# Patient Record
Sex: Male | Born: 1935 | Race: Black or African American | Hispanic: No | Marital: Married | Smoking: Former smoker
Health system: Southern US, Community
[De-identification: ages and names within clinical notes are randomized; demographics above are authoritative.]

## PROBLEM LIST (undated history)

## (undated) DIAGNOSIS — I712 Thoracic aortic aneurysm, without rupture, unspecified: Secondary | ICD-10-CM

## (undated) DIAGNOSIS — N184 Chronic kidney disease, stage 4 (severe): Secondary | ICD-10-CM

## (undated) DIAGNOSIS — I10 Essential (primary) hypertension: Secondary | ICD-10-CM

## (undated) HISTORY — PX: THORACIC AORTIC ANEURYSM REPAIR: SHX799

---

## 2017-08-07 ENCOUNTER — Ambulatory Visit: Payer: Self-pay | Admitting: Surgery

## 2017-08-07 NOTE — H&P (Signed)
General Surgery [facility]  Nicholas LionJosiah Lyons DOB: [birth year] Married / Language: Undefined / Race: Refused to Report/Unreported Male  History of Present Illness  The patient is a 83 year old male who presents with an inguinal hernia.  CC: right inguinal hernia  Patient is referred by Dr. Lynett FishBryan Lyons at [facility] for evaluation of right inguinal hernia. Patient first noted symptoms approximately 6 months ago. At that time they had been moving from one location to another and he had been doing some heavy lifting. Patient has noted the bulge coming slightly larger. He has intermittent episodes of discomfort. Hernia has always been reducible. He has never had any prior hernia repairs. He denies any signs or symptoms of intestinal obstruction. Patient presents today to discuss repair of right inguinal hernia with mesh.   Past Surgical History  Aneurysm Repair   Diagnostic Studies History  Colonoscopy  1-5 years ago  Allergies No Known Drug Allergies 07/07/2017 Allergies Reconciled   Medication History AmLODIPine Besylate (10MG  Tablet, Oral) Active. Aspirin (81MG  Tablet Chewable, Oral) Active. Medications Reconciled  Social History Alcohol use  Remotely quit alcohol use. Caffeine use  Carbonated beverages, Coffee. No drug use  Tobacco use  Former smoker.  Family History Family history unknown  First Degree Relatives   Other Problems Arthritis  Chest pain  High blood pressure    Review of Systems General Not Present- Appetite Loss, Chills, Fatigue, Fever, Night Sweats, Weight Gain and Weight Loss. Skin Not Present- Change in Wart/Mole, Dryness, Hives, Jaundice, New Lesions, Non-Healing Wounds, Rash and Ulcer. HEENT Present- Earache, Hearing Loss, Ringing in the Ears and Seasonal Allergies. Not Present- Hoarseness, Nose Bleed, Oral Ulcers, Sinus Pain, Sore Throat, Visual Disturbances, Wears glasses/contact lenses and Yellow  Eyes. Respiratory Present- Difficulty Breathing, Snoring and Wheezing. Not Present- Bloody sputum and Chronic Cough. Breast Not Present- Breast Mass, Breast Pain, Nipple Discharge and Skin Changes. Cardiovascular Present- Chest Pain, Leg Cramps and Shortness of Breath. Not Present- Difficulty Breathing Lying Down, Palpitations, Rapid Heart Rate and Swelling of Extremities. Gastrointestinal Not Present- Abdominal Pain, Bloating, Bloody Stool, Change in Bowel Habits, Chronic diarrhea, Constipation, Difficulty Swallowing, Excessive gas, Gets full quickly at meals, Hemorrhoids, Indigestion, Nausea, Rectal Pain and Vomiting. Male Genitourinary Not Present- Blood in Urine, Change in Urinary Stream, Frequency, Impotence, Nocturia, Painful Urination, Urgency and Urine Leakage. Musculoskeletal Not Present- Back Pain, Joint Pain, Joint Stiffness, Muscle Pain, Muscle Weakness and Swelling of Extremities. Neurological Not Present- Decreased Memory, Fainting, Headaches, Numbness, Seizures, Tingling, Tremor, Trouble walking and Weakness. Psychiatric Not Present- Anxiety, Bipolar, Change in Sleep Pattern, Depression, Fearful and Frequent crying. Endocrine Not Present- Cold Intolerance, Excessive Hunger, Hair Changes, Heat Intolerance, Hot flashes and New Diabetes. Hematology Not Present- Blood Thinners, Easy Bruising, Excessive bleeding, Gland problems, HIV and Persistent Infections.  Vitals Weight: 157.4 lb Height: 69in Body Surface Area: 1.87 m Body Mass Index: 23.24 kg/m  Temp.: 98.47F  Pulse: 81 (Regular)  BP: 142/84 (Sitting, Left Arm, Standard)   Physical Exam  See vital signs recorded above  GENERAL APPEARANCE Development: normal Nutritional status: normal Gross deformities: none  SKIN Rash, lesions, ulcers: none Induration, erythema: none Nodules: none palpable  EYES Conjunctiva and lids: normal Pupils: equal and reactive Iris: normal bilaterally  EARS, NOSE, MOUTH,  THROAT External ears: no lesion or deformity External nose: no lesion or deformity Hearing: grossly normal Lips: no lesion or deformity Dentition: normal for age Oral mucosa: moist  NECK Symmetric: yes Trachea: midline Thyroid: no palpable nodules  in the thyroid bed  CHEST Respiratory effort: normal Retraction or accessory muscle use: no Breath sounds: normal bilaterally Rales, rhonchi, wheeze: none Well-healed transverse scar anterior upper chest wall and epigastrium  CARDIOVASCULAR Auscultation: regular rhythm, normal rate Murmurs: none Pulses: carotid and radial pulse 2+ palpable Lower extremity edema: none Lower extremity varicosities: none  ABDOMEN Distension: none Masses: none palpable Tenderness: none Hepatosplenomegaly: not present Hernia: not present  GENITOURINARY Penis: no lesions Scrotum: no masses Obvious bulge right groin. Palpation right inguinal canal with cough and Valsalva shows a small to moderate bulge which augments but is reducible with gentle manipulation. Hernia prolapses with slight Valsalva. Palpation in the left inguinal canal with cough and Valsalva shows no sign of hernia.  MUSCULOSKELETAL Station and gait: normal Digits and nails: no clubbing or cyanosis Muscle strength: grossly normal all extremities Range of motion: grossly normal all extremities Deformity: none  LYMPHATIC Cervical: none palpable Supraclavicular: none palpable  PSYCHIATRIC Oriented to person, place, and time: yes Mood and affect: normal for situation Judgment and insight: appropriate for situation    Assessment & Plan  INGUINAL HERNIA OF RIGHT SIDE WITHOUT OBSTRUCTION OR GANGRENE (K40.90)  Pt Education - Pamphlet Given - Hernia Surgery: discussed with patient and provided information. Patient is referred by his urologist for evaluation of right inguinal hernia. He is accompanied by a family member. They are provided with written literature on hernia  repair to review at home.  I have recommended repair of right inguinal hernia by open technique using prosthetic mesh. We have discussed the risk and benefits of the procedure. We discussed the use of mesh. We discussed restrictions on his activities following the surgery. This would be an outpatient surgical procedure. Patient understands and wishes to proceed with surgery after he returns from an upcoming trip to [place].  The risks and benefits of the procedure have been discussed at length with the patient. The patient understands the proposed procedure, potential alternative treatments, and the course of recovery to be expected. All of the patient's questions have been answered at this time. The patient wishes to proceed with surgery.  Darnell Levelodd Solimar Maiden, MD [facility] Office: [phone]

## 2017-09-08 ENCOUNTER — Encounter (HOSPITAL_COMMUNITY): Admission: RE | Admit: 2017-09-08 | Payer: Medicare Other | Source: Ambulatory Visit

## 2017-09-11 ENCOUNTER — Ambulatory Visit (HOSPITAL_COMMUNITY): Admission: RE | Admit: 2017-09-11 | Payer: Medicare Other | Source: Ambulatory Visit | Admitting: Surgery

## 2017-09-11 ENCOUNTER — Encounter (HOSPITAL_COMMUNITY): Admission: RE | Payer: Self-pay | Source: Ambulatory Visit

## 2017-09-11 SURGERY — REPAIR, HERNIA, INGUINAL, ADULT
Anesthesia: General | Laterality: Right

## 2017-11-07 ENCOUNTER — Other Ambulatory Visit: Payer: Self-pay

## 2017-11-07 ENCOUNTER — Encounter (HOSPITAL_BASED_OUTPATIENT_CLINIC_OR_DEPARTMENT_OTHER): Payer: Self-pay | Admitting: *Deleted

## 2017-11-07 ENCOUNTER — Observation Stay (HOSPITAL_BASED_OUTPATIENT_CLINIC_OR_DEPARTMENT_OTHER)
Admission: EM | Admit: 2017-11-07 | Discharge: 2017-11-08 | Disposition: A | Discharge status: Home / Self Care | Payer: Medicare Other | Attending: Internal Medicine | Admitting: Internal Medicine

## 2017-11-07 ENCOUNTER — Emergency Department (HOSPITAL_BASED_OUTPATIENT_CLINIC_OR_DEPARTMENT_OTHER): Payer: Medicare Other

## 2017-11-07 DIAGNOSIS — R748 Abnormal levels of other serum enzymes: Secondary | ICD-10-CM | POA: Insufficient documentation

## 2017-11-07 DIAGNOSIS — R7989 Other specified abnormal findings of blood chemistry: Secondary | ICD-10-CM

## 2017-11-07 DIAGNOSIS — R778 Other specified abnormalities of plasma proteins: Secondary | ICD-10-CM

## 2017-11-07 DIAGNOSIS — N184 Chronic kidney disease, stage 4 (severe): Secondary | ICD-10-CM | POA: Diagnosis not present

## 2017-11-07 DIAGNOSIS — I129 Hypertensive chronic kidney disease with stage 1 through stage 4 chronic kidney disease, or unspecified chronic kidney disease: Secondary | ICD-10-CM | POA: Diagnosis not present

## 2017-11-07 DIAGNOSIS — R079 Chest pain, unspecified: Secondary | ICD-10-CM | POA: Diagnosis present

## 2017-11-07 DIAGNOSIS — Z87891 Personal history of nicotine dependence: Secondary | ICD-10-CM | POA: Diagnosis not present

## 2017-11-07 DIAGNOSIS — I160 Hypertensive urgency: Secondary | ICD-10-CM | POA: Insufficient documentation

## 2017-11-07 DIAGNOSIS — Z79899 Other long term (current) drug therapy: Secondary | ICD-10-CM | POA: Insufficient documentation

## 2017-11-07 DIAGNOSIS — R072 Precordial pain: Secondary | ICD-10-CM | POA: Diagnosis not present

## 2017-11-07 DIAGNOSIS — D649 Anemia, unspecified: Secondary | ICD-10-CM | POA: Insufficient documentation

## 2017-11-07 DIAGNOSIS — I10 Essential (primary) hypertension: Secondary | ICD-10-CM | POA: Diagnosis not present

## 2017-11-07 HISTORY — DX: Essential (primary) hypertension: I10

## 2017-11-07 HISTORY — DX: Thoracic aortic aneurysm, without rupture, unspecified: I71.20

## 2017-11-07 HISTORY — DX: Chronic kidney disease, stage 4 (severe): N18.4

## 2017-11-07 HISTORY — DX: Thoracic aortic aneurysm, without rupture: I71.2

## 2017-11-07 LAB — BASIC METABOLIC PANEL
Anion gap: 9 (ref 5–15)
BUN: 26 mg/dL — ABNORMAL HIGH (ref 6–20)
CALCIUM: 9.2 mg/dL (ref 8.9–10.3)
CO2: 22 mmol/L (ref 22–32)
CREATININE: 2.79 mg/dL — AB (ref 0.61–1.24)
Chloride: 108 mmol/L (ref 101–111)
GFR, EST AFRICAN AMERICAN: 22 mL/min — AB (ref >60)
GFR, EST NON AFRICAN AMERICAN: 19 mL/min — AB (ref >60)
Glucose, Bld: 95 mg/dL (ref 65–99)
Potassium: 4.4 mmol/L (ref 3.5–5.1)
SODIUM: 139 mmol/L (ref 135–145)

## 2017-11-07 LAB — CBC WITH DIFFERENTIAL/PLATELET
BASOS ABS: 0 10*3/uL (ref 0.0–0.1)
BASOS PCT: 0 %
EOS ABS: 0.3 10*3/uL (ref 0.0–0.7)
EOS PCT: 3 %
HCT: 35.3 % — ABNORMAL LOW (ref 39.0–52.0)
Hemoglobin: 11.8 g/dL — ABNORMAL LOW (ref 13.0–17.0)
Lymphocytes Relative: 38 %
Lymphs Abs: 2.9 10*3/uL (ref 0.7–4.0)
MCH: 28.4 pg (ref 26.0–34.0)
MCHC: 33.4 g/dL (ref 30.0–36.0)
MCV: 84.9 fL (ref 78.0–100.0)
Monocytes Absolute: 0.7 10*3/uL (ref 0.1–1.0)
Monocytes Relative: 9 %
Neutro Abs: 3.7 10*3/uL (ref 1.7–7.7)
Neutrophils Relative %: 50 %
PLATELETS: 189 10*3/uL (ref 150–400)
RBC: 4.16 MIL/uL — AB (ref 4.22–5.81)
RDW: 13.4 % (ref 11.5–15.5)
WBC: 7.6 10*3/uL (ref 4.0–10.5)

## 2017-11-07 LAB — TROPONIN I: TROPONIN I: 0.06 ng/mL — AB (ref <0.03)

## 2017-11-07 MED ORDER — HYDRALAZINE HCL 20 MG/ML IJ SOLN: 10.0000 mg | INTRAMUSCULAR | Status: DC | PRN

## 2017-11-07 MED ORDER — ONDANSETRON HCL 4 MG/2ML IJ SOLN: 4.0000 mg | Freq: Four times a day (QID) | INTRAMUSCULAR | Status: DC | PRN

## 2017-11-07 MED ORDER — ASPIRIN 325 MG PO TABS
325.0000 mg | ORAL_TABLET | Freq: Every day | ORAL | Status: DC
  Administered 2017-11-08: 325 mg via ORAL
  Filled 2017-11-07 (×2): qty 1

## 2017-11-07 MED ORDER — AMLODIPINE BESYLATE 10 MG PO TABS
10.0000 mg | ORAL_TABLET | Freq: Every day | ORAL | Status: DC
  Administered 2017-11-07 – 2017-11-08 (×2): 10 mg via ORAL
  Filled 2017-11-07 (×2): qty 1

## 2017-11-07 MED ORDER — ACETAMINOPHEN 325 MG PO TABS: 650.0000 mg | ORAL_TABLET | ORAL | Status: DC | PRN

## 2017-11-07 MED ORDER — ENOXAPARIN SODIUM 40 MG/0.4ML ~~LOC~~ SOLN
40.0000 mg | SUBCUTANEOUS | Status: DC
  Administered 2017-11-07: 40 mg via SUBCUTANEOUS
  Filled 2017-11-07: qty 0.4

## 2017-11-07 MED ORDER — FERROUS SULFATE 325 (65 FE) MG PO TABS
325.0000 mg | ORAL_TABLET | Freq: Every day | ORAL | Status: DC
  Administered 2017-11-08: 325 mg via ORAL
  Filled 2017-11-07: qty 1

## 2017-11-07 MED ORDER — AMLODIPINE BESYLATE 10 MG PO TABS: 10.0000 mg | ORAL_TABLET | Freq: Every day | ORAL | Status: DC

## 2017-11-07 NOTE — ED Notes (Signed)
ED Provider at bedside. 

## 2017-11-07 NOTE — Plan of Care (Signed)
  Education: Knowledge of General Education information will improve 11/07/2017 2333 - Completed/Met by Theora Gianotti, RN   Pt oriented to unit, plan of care, and method of reporting concerns. Verbalizes understanding of need to call for assistance prior to ambulation when necessary. Bedside table and call light within reach.

## 2017-11-07 NOTE — ED Notes (Signed)
[facility]

## 2017-11-07 NOTE — ED Notes (Signed)
Spoke with the patient and wife and both of them stated that he does not want to be admitted.  Dr. Eudelia Bunchardama and Dr. Marijean Bravoaye G both spoke with patient and family.

## 2017-11-07 NOTE — ED Notes (Addendum)
Patient's son refused to sign the consent for [facility] to be [facility].  He stated that he just called the PCP for EKG result and they are going to fax it here to be compared.

## 2017-11-07 NOTE — ED Triage Notes (Addendum)
Chest pain since this am. Sharp pain in his left chest that comes and goes. [facility] to coming here. He had a normal EKG there.

## 2017-11-07 NOTE — ED Notes (Signed)
Arline AspCindy of [facility] called for report and informed her that patient refused to be admitted.  Patient refused to signed transfer consent.

## 2017-11-07 NOTE — ED Provider Notes (Signed)
[facility] EMERGENCY DEPARTMENT Provider Note   CSN: [id number] Arrival date & time: 11/07/17  1435     History   Chief Complaint Chief Complaint  Patient presents with  . Chest Pain    HPI Nicholas Lyons is a 84 y.o. male with history of hypertension presenting with chest pain for the last 4 hours. Patient is here with his wife and his son who contributed to history.  Patient reports having recurrent left chest pain since 11 am this morning. He says he was cooking when he started having chest pain.  He is pointing at his left chest just under his left breast to localize the pain. Denies radiation. Pain was sharp and lasts few seconds.  He also reports intermittent exertional dyspnea although the family disagrees with him saying he overexerts himself.  He denies leg swelling or calf tenderness.  He recently traveled from [place] to [place] 3 weeks ago. Wife says it was a 7 hour car ride. He has history of hypertension.  He has been out of his blood pressure medication for the last 3 weeks.  He also reports history of thoracic aortic aneurysm surgery in 2008 in 2014. Denies history of shingles or blood clot. Denies history of heart attack or stroke. He denies headache, vision change, nausea, vomiting, diaphoresis, abdominal pain, dysuria or focal numbness or weakness. Denies smoking cigarettes.  Denies drinking alcohol or recreational drug use.  HPI  Past Medical History:  Diagnosis Date  . Abdominal aneurysm (HCC)   . Hypertension     There are no active problems to display for this patient.   Past Surgical History:  Procedure Laterality Date  . ABDOMINAL SURGERY         Home Medications    Prior to Admission medications   Medication Sig Start Date End Date Taking? Authorizing Provider  amLODipine (NORVASC) 10 MG tablet Take 10 mg by mouth daily.    [provider]  Multiple Vitamin (ONE-A-DAY MENS PO) Take 1 tablet by mouth daily.     [provider]    Family History No family history on file.  Social History Social History   Tobacco Use  . Smoking status: Former Games developermoker  . Smokeless tobacco: Never Used  Substance Use Topics  . Alcohol use: No    Frequency: Never  . Drug use: No     Allergies   Patient has no known allergies.   Review of Systems Review of Systems  Constitutional: Negative for chills and fever.  HENT: Negative for congestion, ear pain, rhinorrhea and sore throat.   Eyes: Negative for visual disturbance.  Respiratory: Positive for shortness of breath. Negative for cough.   Cardiovascular: Positive for chest pain. Negative for palpitations.  Gastrointestinal: Negative for abdominal pain, blood in stool, diarrhea, nausea and vomiting.  Genitourinary: Negative for dysuria and hematuria.  Musculoskeletal: Negative for arthralgias and back pain.  Skin: Negative for color change and rash.  Neurological: Negative for seizures and syncope.  All other systems reviewed and are negative.  Physical Exam Updated Vital Signs BP (!) 182/83   Pulse (!) 58   Temp 97.6 F (36.4 C) (Oral)   Resp (!) 22   Ht 5\' 9"  (1.753 m)   Wt 70.3 kg (155 lb)   SpO2 100%   BMI 22.89 kg/m   Physical Exam GEN: appears well, no apparent distress. Head: normocephalic and atraumatic  Eyes: conjunctiva without injection, sclera anicteric Oropharynx: mmm without erythema, exudation or  petechiae. HEM: negative for cervical or periauricular lymphadenopathies CVS: Bradycardic to 56, RR, nl s1 & s2, no murmurs, no edema, radial pulses 2+ bilaterally. RESP: no IWOB, good air movement bilaterally, CTAB GI: BS present & normal, soft, NTND GU: no suprapubic or CVA tenderness MSK: Tenderness to palpation over his ribs just lateral to his left breast.  No swelling overlying skin lesion. SKIN: no apparent skin lesion NEURO: alert and oiented appropriately, no gross deficits  PSYCH: euthymic mood with  congruent affect  ED Treatments / Results  Labs (all labs ordered are listed, but only abnormal results are displayed) Labs Reviewed  BASIC METABOLIC PANEL - Abnormal; Notable for the following components:      Result Value   BUN 26 (*)    Creatinine, Ser 2.79 (*)    GFR calc non Af Amer 19 (*)    GFR calc Af Amer 22 (*)    All other components within normal limits  CBC WITH DIFFERENTIAL/PLATELET - Abnormal; Notable for the following components:   RBC 4.16 (*)    Hemoglobin 11.8 (*)    HCT 35.3 (*)    All other components within normal limits  TROPONIN I  D-DIMER, QUANTITATIVE (NOT AT [facility])    EKG  EKG Interpretation None       Radiology Dg Chest 2 View  Result Date: 11/07/2017 CLINICAL DATA:  Chest pain today, dyspnea, former smoker. EXAM: CHEST - 2 VIEW COMPARISON:  None. FINDINGS: Heart size is normal. The patient is status post median sternotomy and aortic stent grafting from the aortic arch to proximal descending thoracic aorta. Lungs are hyperinflated but clear. Apical pleuroparenchymal thickening is noted bilaterally. No acute nor suspicious osseous lesions. Calcified left hilar lymph node is noted. IMPRESSION: Mild pulmonary hyperinflation without active pulmonary disease. Electronically Signed   By: Tollie Eth M.D.   On: 11/07/2017 15:43    Procedures Procedures (including critical care time)  Medications Ordered in ED Medications - No data to display   Initial Impression / Assessment and Plan / ED Course  I have reviewed the triage vital signs and the nursing notes.  Pertinent labs & imaging results that were available during my care of the patient were reviewed by me and considered in my medical decision making (see chart for details).  Clinical Course as of Nov 07 1624  Fri Nov 07, 2017  1621 Troponin I: (!!) 0.06 [PC]    Clinical Course User Index [PC] Nira Conn, MD   20:35 PM 84 year old male with history of hypertension (off medication  for the last 3 weeks) presented with a typical chest pain for the last 4 hours.  He is tender to palpation lateral to his left breast.  He has no swelling or overlying skin lesion.  He denies history of shingles.  He also reports exertional dyspnea although family thinks the later was likely due to him over exerting himself. Initial EKG with bradycardia, first-degree AV block and TW flattening.  Initial troponin 0 0.06 warranting ACS rule out. Will obtain CTA dissection protocol given history of thoracic aortic aneurysm and recent long car ride.  He has no signs and symptoms of infectious etiology.   4:42 PM GFR 22. Cancel CTA and transfer to [facility] for MRI to rule out dissection. Admitting team consulted.  5:45 PM Patient reports having similar chest pain over the same location for the last 5 years although he initially said his chest pain is new. Son upset about admission despite all efforts  to explain the need to rule out ACS.   Patient admitted to [facility] for ACS rule out and further evaluation.  Final Clinical Impressions(s) / ED Diagnoses   Final diagnoses:  None    ED Discharge Orders    None       Almon Hercules, MD 11/07/17 2031    Nira Conn, MD 11/07/17 2351    Nira Conn, MD 11/07/17 [phone]

## 2017-11-07 NOTE — ED Notes (Signed)
Family at bedside. 

## 2017-11-07 NOTE — H&P (Signed)
History and Physical    Nicholas Lyons:[id number] DOB: Jun 15, 1930 DOA: 11/07/2017  PCP: Filomena Jungling, NP  Patient coming from: Home  I have personally briefly reviewed patient's old medical records in [facility]  Chief Complaint: Chest pain  HPI: Nicholas Lyons is a 84 y.o. male with medical history significant of CKD stage 4, HTN, ascending aortic aneurysm s/p repair surgeries in 2008 and 2014.  Patient reports having recurrent left chest pain since 11 am this morning. He says he was cooking when he started having chest pain.  He is pointing at his left chest just under his left breast to localize the pain. Denies radiation. Pain was sharp and lasts few seconds.  Per family he has been having episodic CP like this for the past 5 years since open heart surgery.  He has had 2 times where he had worse episodes like today.  First time was after 2 of his Sons died around the same time, second was today.  Due to being out of town, he has been out of his blood pressure medication for the last 3 weeks.   ED Course: BP 192/100 in ED, couple of readings above 180.  Trop 0.06.  They were going to get CTA to r/o dissection but his creat is 2.7 today (turns out CKD stage 4 is chronic).  Also his CP is resolved completely at the moment (not really consistent with acute dissection).   Review of Systems: As per HPI otherwise 10 point review of systems negative.   Past Medical History:  Diagnosis Date  . CKD (chronic kidney disease) stage 4, GFR 15-29 ml/min (HCC)   . Hypertension   . Thoracic aortic aneurysm [facility])     Past Surgical History:  Procedure Laterality Date  . THORACIC AORTIC ANEURYSM REPAIR  2008, 2014     reports that he has quit smoking. he has never used smokeless tobacco. He reports that he does not drink alcohol or use drugs.  No Known Allergies  History reviewed. No pertinent family history.   Prior to Admission medications   Medication Sig Start Date End Date  Taking? Authorizing Provider  amLODipine (NORVASC) 10 MG tablet Take 10 mg by mouth daily.    [provider]  Multiple Vitamin (ONE-A-DAY MENS PO) Take 1 tablet by mouth daily.    [provider]    Physical Exam: Vitals:   11/07/17 1800 11/07/17 1830 11/07/17 1900 11/07/17 2111  BP: (!) 176/93 (!) 167/76 (!) 181/90 (!) 177/73  Pulse: (!) 58 (!) 56 61 (!) 56  Resp: (!) 22 (!) 0 (!) 23 20  Temp:    (!) 97.5 F (36.4 C)  TempSrc:    Oral  SpO2: 100% 100% 100% 100%  Weight:      Height:        Constitutional: NAD, calm, comfortable Eyes: PERRL, lids and conjunctivae normal ENMT: Mucous membranes are moist. Posterior pharynx clear of any exudate or lesions.Normal dentition.  Neck: normal, supple, no masses, no thyromegaly Respiratory: clear to auscultation bilaterally, no wheezing, no crackles. Normal respiratory effort. No accessory muscle use.  Cardiovascular: Regular rate and rhythm, no murmurs / rubs / gallops. No extremity edema. 2+ pedal pulses. No carotid bruits.  Abdomen: no tenderness, no masses palpated. No hepatosplenomegaly. Bowel sounds positive.  Musculoskeletal: no clubbing / cyanosis. No joint deformity upper and lower extremities. Good ROM, no contractures. Normal muscle tone.  Skin: no rashes, lesions, ulcers. No induration Neurologic: CN 2-12 grossly intact. Sensation  intact, DTR normal. Strength 5/5 in all 4.  Psychiatric: Normal judgment and insight. Alert and oriented x 3. Normal mood.    Labs on Admission: I have personally reviewed following labs and imaging studies  CBC: Recent Labs  Lab 11/07/17 1527  WBC 7.6  NEUTROABS 3.7  HGB 11.8*  HCT 35.3*  MCV 84.9  PLT 189   Basic Metabolic Panel: Recent Labs  Lab 11/07/17 1527  NA 139  K 4.4  CL 108  CO2 22  GLUCOSE 95  BUN 26*  CREATININE 2.79*  CALCIUM 9.2   GFR: Estimated Creatinine Clearance: 19.2 mL/min (A) (by C-G formula based on SCr of 2.79 mg/dL (H)). Liver Function  Tests: No results for input(s): AST, ALT, ALKPHOS, BILITOT, PROT, ALBUMIN in the last 168 hours. No results for input(s): LIPASE, AMYLASE in the last 168 hours. No results for input(s): AMMONIA in the last 168 hours. Coagulation Profile: No results for input(s): INR, PROTIME in the last 168 hours. Cardiac Enzymes: Recent Labs  Lab 11/07/17 1527  TROPONINI 0.06*   BNP (last 3 results) No results for input(s): PROBNP in the last 8760 hours. HbA1C: No results for input(s): HGBA1C in the last 72 hours. CBG: No results for input(s): GLUCAP in the last 168 hours. Lipid Profile: No results for input(s): CHOL, HDL, LDLCALC, TRIG, CHOLHDL, LDLDIRECT in the last 72 hours. Thyroid Function Tests: No results for input(s): TSH, T4TOTAL, FREET4, T3FREE, THYROIDAB in the last 72 hours. Anemia Panel: No results for input(s): VITAMINB12, FOLATE, FERRITIN, TIBC, IRON, RETICCTPCT in the last 72 hours. Urine analysis: No results found for: COLORURINE, APPEARANCEUR, LABSPEC, PHURINE, GLUCOSEU, HGBUR, BILIRUBINUR, KETONESUR, PROTEINUR, UROBILINOGEN, NITRITE, LEUKOCYTESUR  Radiological Exams on Admission: Dg Chest 2 View  Result Date: 11/07/2017 CLINICAL DATA:  Chest pain today, dyspnea, former smoker. EXAM: CHEST - 2 VIEW COMPARISON:  None. FINDINGS: Heart size is normal. The patient is status post median sternotomy and aortic stent grafting from the aortic arch to proximal descending thoracic aorta. Lungs are hyperinflated but clear. Apical pleuroparenchymal thickening is noted bilaterally. No acute nor suspicious osseous lesions. Calcified left hilar lymph node is noted. IMPRESSION: Mild pulmonary hyperinflation without active pulmonary disease. Electronically Signed   By: Tollie Ethavid  Kwon M.D.   On: 11/07/2017 15:43    EKG: Independently reviewed.  Assessment/Plan Principal Problem:   Chest pain Active Problems:   HTN (hypertension)   CKD (chronic kidney disease) stage 4, GFR 15-29 ml/min (HCC)     1. CP - HTN urgency seems most likely, dissection seems less likely with completely resolved CP at the moment, PE after car ride today is possible but large PE seems less likely given HR 60s and very hypertensive.  CAD is possible. 1. CP obs pathway 2. Serial trops 3. Tele monitor 4. Cards eval in AM 2. HTN - 1. Restart amlodipine 2. Hydralazine PRN if needed 3. CKD stage 4 - 1. Chronic and likely baseline based on labs in care-everywhere from last year 2. Needs OP follow up with nephrology  DVT prophylaxis: Lovenox Code Status: Full Family Communication: Wife at bedside Disposition Plan: Home after admit Consults called: Message put into [facility] for cards eval in AM Admission status: Place in [place]   Riot [place], [place] M. DO [facility] Pager [phone]  If 7AM-7PM, please contact day team taking care of patient [email or web address] Password [email or web address]  11/07/2017, 9:33 PM

## 2017-11-08 ENCOUNTER — Observation Stay (HOSPITAL_BASED_OUTPATIENT_CLINIC_OR_DEPARTMENT_OTHER): Payer: Medicare Other

## 2017-11-08 ENCOUNTER — Encounter (HOSPITAL_COMMUNITY): Payer: Self-pay | Admitting: Cardiology

## 2017-11-08 DIAGNOSIS — I160 Hypertensive urgency: Secondary | ICD-10-CM

## 2017-11-08 DIAGNOSIS — N184 Chronic kidney disease, stage 4 (severe): Secondary | ICD-10-CM | POA: Diagnosis not present

## 2017-11-08 DIAGNOSIS — R748 Abnormal levels of other serum enzymes: Secondary | ICD-10-CM | POA: Diagnosis not present

## 2017-11-08 DIAGNOSIS — R079 Chest pain, unspecified: Secondary | ICD-10-CM | POA: Diagnosis not present

## 2017-11-08 DIAGNOSIS — R072 Precordial pain: Secondary | ICD-10-CM | POA: Diagnosis not present

## 2017-11-08 LAB — NM MYOCAR MULTI W/SPECT W/WALL MOTION / EF
CHL CUP RESTING HR STRESS: 58 {beats}/min
CSEPED: 5 min
CSEPEW: 1 METS
CSEPHR: 69 %
MPHR: 135 {beats}/min
Peak HR: 94 {beats}/min

## 2017-11-08 LAB — TROPONIN I
TROPONIN I: 0.04 ng/mL — AB (ref <0.03)
Troponin I: 0.04 ng/mL (ref <0.03)
Troponin I: 0.04 ng/mL (ref <0.03)

## 2017-11-08 MED ORDER — TECHNETIUM TC 99M TETROFOSMIN IV KIT
10.0000 | PACK | Freq: Once | INTRAVENOUS | Status: AC | PRN
  Administered 2017-11-08: 10 via INTRAVENOUS

## 2017-11-08 MED ORDER — REGADENOSON 0.4 MG/5ML IV SOLN
0.4000 mg | Freq: Once | INTRAVENOUS | Status: AC
  Administered 2017-11-08: 0.4 mg via INTRAVENOUS
  Filled 2017-11-08: qty 5

## 2017-11-08 MED ORDER — REGADENOSON 0.4 MG/5ML IV SOLN
INTRAVENOUS | Status: AC
  Administered 2017-11-08: 0.4 mg via INTRAVENOUS
  Filled 2017-11-08: qty 5

## 2017-11-08 MED ORDER — TECHNETIUM TC 99M TETROFOSMIN IV KIT
30.0000 | PACK | Freq: Once | INTRAVENOUS | Status: AC | PRN
  Administered 2017-11-08: 30 via INTRAVENOUS

## 2017-11-08 NOTE — Discharge Summary (Signed)
Physician Discharge Summary  Nicholas Lyons ZOX:[id number] DOB: Jul 10, 1930 DOA: 11/07/2017  PCP: Filomena Jungling, NP  Admit date: 11/07/2017 Discharge date: 11/08/2017  Admitted From: home  Disposition:  home   Recommendations for Outpatient Follow-up:  1. Needs referral to nephrology Discharge Condition:  stable   CODE STATUS:  Full code   Consultations:  Cardiology     Discharge Diagnoses:  Principal Problem:   Chest pain Active Problems:   HTN (hypertension)   CKD (chronic kidney disease) stage 4, GFR 15-29 ml/min (HCC)    Subjective: No further chest pain - no other complaints.   HPI: Nicholas Lyons is a 84 y.o. male with medical history significant of CKD stage 4, HTN, ascending aortic aneurysm s/p repair surgeries in 2008 and 2014. He presents with chest pain while cooking which was left sided, non radiating and severe. BP in ER noted to be 192/100. He had been out of town for months and did not have his Norvasc. He states his primary care provider stated that he would need to be seen in the office prior to getting a refill on his medication.   Hospital Course:  Chest pain - possibly due to severe HTN - mild elevated troponin at 0.06 to 0.04- pain has resolved- cardiology recommends a Myoview stress test which was performed today and is negative  Hypertensive urgency - due to not having appropriate home meds- have resumed Norvasc and BP has improved- he is advised to continue to check BP at home and f/u with PCP for further management- discussed the importance of adequate BP control  CKD 4 - needs referral to a nephrologist   Anemia  -cont oral Iron- f/u on erythropoietin levels and need for Aranesp (due to severe CKD) as outpt   Discharge Exam: Vitals:   11/08/17 0446 11/08/17 1231  BP: 129/66 (!) 157/72  Pulse: 60 (!) 57  Resp: 20   Temp: 97.9 F (36.6 C) 97.6 F (36.4 C)  SpO2: 97% 100%   Vitals:   11/07/17 2111 11/08/17 0008 11/08/17 0446 11/08/17 1231  BP:  (!) 177/73 (!) 143/70 129/66 (!) 157/72  Pulse: (!) 56 62 60 (!) 57  Resp: 20 20 20    Temp: (!) 97.5 F (36.4 C) 97.7 F (36.5 C) 97.9 F (36.6 C) 97.6 F (36.4 C)  TempSrc: Oral Oral Oral Oral  SpO2: 100% 99% 97% 100%  Weight:  69.5 kg (153 lb 3.2 oz)    Height:  5\' 9"  (1.753 m)      General: Pt is alert, awake, not in acute distress Cardiovascular: RRR, S1/S2 +, no rubs, no gallops Respiratory: CTA bilaterally, no wheezing, no rhonchi Abdominal: Soft, NT, ND, bowel sounds + Extremities: no edema, no cyanosis   Discharge Instructions  Discharge Instructions    Diet - low sodium heart healthy   Complete by:  As directed    Increase activity slowly   Complete by:  As directed      Allergies as of 11/08/2017      Reactions   Lisinopril Anaphylaxis, Swelling      Medication List    TAKE these medications   amLODipine 10 MG tablet Commonly known as:  NORVASC Take 10 mg by mouth daily.   aspirin EC 325 MG tablet Take 650 mg by mouth daily.   ferrous sulfate 325 (65 FE) MG tablet Take 325 mg by mouth daily with breakfast.   ONE-A-DAY MENS PO Take 1 tablet by mouth daily.  Allergies  Allergen Reactions  . Lisinopril Anaphylaxis and Swelling     Procedures/Studies:  Dg Chest 2 View  Result Date: 11/07/2017 CLINICAL DATA:  Chest pain today, dyspnea, former smoker. EXAM: CHEST - 2 VIEW COMPARISON:  None. FINDINGS: Heart size is normal. The patient is status post median sternotomy and aortic stent grafting from the aortic arch to proximal descending thoracic aorta. Lungs are hyperinflated but clear. Apical pleuroparenchymal thickening is noted bilaterally. No acute nor suspicious osseous lesions. Calcified left hilar lymph node is noted. IMPRESSION: Mild pulmonary hyperinflation without active pulmonary disease. Electronically Signed   By: Tollie Ethavid  Kwon M.D.   On: 11/07/2017 15:43   Nm Myocar Multi W/spect W/wall Motion / Ef  Result Date: 11/08/2017  There was  no ST segment deviation noted during stress.  No T wave inversion was noted during stress.  Nuclear stress EF: 54%.  The left ventricular ejection fraction is mildly decreased (45-54%).  This is a low risk study. The study is normal.     - The results of significant diagnostics from this hospitalization (including imaging, microbiology, ancillary and laboratory) are listed below for reference.     Microbiology: No results found for this or any previous visit (from the past 240 hour(s)).   Labs: BNP (last 3 results) No results for input(s): BNP in the last 8760 hours. Basic Metabolic Panel: Recent Labs  Lab 11/07/17 1527  NA 139  K 4.4  CL 108  CO2 22  GLUCOSE 95  BUN 26*  CREATININE 2.79*  CALCIUM 9.2   Liver Function Tests: No results for input(s): AST, ALT, ALKPHOS, BILITOT, PROT, ALBUMIN in the last 168 hours. No results for input(s): LIPASE, AMYLASE in the last 168 hours. No results for input(s): AMMONIA in the last 168 hours. CBC: Recent Labs  Lab 11/07/17 1527  WBC 7.6  NEUTROABS 3.7  HGB 11.8*  HCT 35.3*  MCV 84.9  PLT 189   Cardiac Enzymes: Recent Labs  Lab 11/07/17 1527 11/07/17 2308 11/08/17 0109 11/08/17 0348  TROPONINI 0.06* 0.04* 0.04* 0.04*   BNP: Invalid input(s): POCBNP CBG: No results for input(s): GLUCAP in the last 168 hours. D-Dimer No results for input(s): DDIMER in the last 72 hours. Hgb A1c No results for input(s): HGBA1C in the last 72 hours. Lipid Profile No results for input(s): CHOL, HDL, LDLCALC, TRIG, CHOLHDL, LDLDIRECT in the last 72 hours. Thyroid function studies No results for input(s): TSH, T4TOTAL, T3FREE, THYROIDAB in the last 72 hours.  Invalid input(s): FREET3 Anemia work up No results for input(s): VITAMINB12, FOLATE, FERRITIN, TIBC, IRON, RETICCTPCT in the last 72 hours. Urinalysis No results found for: COLORURINE, APPEARANCEUR, LABSPEC, PHURINE, GLUCOSEU, HGBUR, BILIRUBINUR, KETONESUR, PROTEINUR,  UROBILINOGEN, NITRITE, LEUKOCYTESUR Sepsis Labs Invalid input(s): PROCALCITONIN,  WBC,  LACTICIDVEN Microbiology No results found for this or any previous visit (from the past 240 hour(s)).   Time coordinating discharge: Over 30 minutes  SIGNED:   Calvert CantorSaima Krayton Wortley, MD  [facility] 11/08/2017, 2:24 PM Pager   If 7PM-7AM, please contact night-coverage [email or web address] Password [email or web address]

## 2017-11-08 NOTE — Consult Note (Addendum)
Cardiology Consult    Patient ID: Elyan Vanwieren MRN: [id number], DOB/AGE: 1930-05-26   Admit date: 11/07/2017 Date of Consult: 11/08/2017  Primary Physician: Filomena Jungling, NP Primary Cardiologist: Ellis Parents ( Moved here from [place] 3 years ago) Requesting Provider: Dr. Anthoney Harada Reason for Consultation: Chest pain  Shalik Sanfilippo is a 84 y.o. male who is being seen today for the evaluation of chest pain at the request of Dr. Anthoney Harada.   Patient Profile    84 yo male with PMH of HTN, Thoracic aortic aneurysm s/p repair, and CKD stage IV who presented with hypertension and chest pain with positive troponin.   Past Medical History   Past Medical History:  Diagnosis Date  . CKD (chronic kidney disease) stage 4, GFR 15-29 ml/min (HCC)   . Hypertension   . Thoracic aortic aneurysm [facility])     Past Surgical History:  Procedure Laterality Date  . THORACIC AORTIC ANEURYSM REPAIR  2008, 2014     Allergies  No Known Allergies  History of Present Illness    Mr. Gastelum is a 84 yo male with PMH of  HTN, Thoracic aortic aneurysm s/p repair, and CKD stage IV. Reports he was living in [place] until about 3 years when he and his wife moved here to be closer to his son. Had a thoracic aortic aneurysm repair back in 2014 and thinks he had a heart cath at that time, but unsure. Does report having a stress test back about 3 years ago prior to moving here that was normal. States he has had episodes of chest pain since having his surgery around his incision site that come and go and brief in nature. He is very active around his home and does not normally have episodes of chest pain or shortness of breath.   Yesterday morning around 11am he was standing in the kitchen cooking when he developed sudden onset of sharp left sided chest pain. Rated this a 10/10 in nature. Symptoms lingered for several hours and he presented to [facility]. There his blood pressure was noted to elevated in the 190s systolic. He did report being out of his  blood pressures medications for the past 3 weeks as he traveled back home. Pain subsided as his blood pressure improved. EKG showed SB with 1st degree AVB. Trop 0.06>>0.05>>0.04 in the setting of CKD with Cr 2.7. No further episodes of chest pain since admission.   Inpatient Medications    . amLODipine  10 mg Oral Daily  . aspirin  325 mg Oral Daily  . enoxaparin (LOVENOX) injection  40 mg Subcutaneous Q24H  . ferrous sulfate  325 mg Oral Q breakfast    Family History    Family History  Problem Relation Age of Onset  . Heart disease Mother     Social History    Social History   Socioeconomic History  . Marital status: Married    Spouse name: Not on file  . Number of children: Not on file  . Years of education: Not on file  . Highest education level: Not on file  Social Needs  . Financial resource strain: Not on file  . Food insecurity - worry: Not on file  . Food insecurity - inability: Not on file  . Transportation needs - medical: Not on file  . Transportation needs - non-medical: Not on file  Occupational History  . Not on file  Tobacco Use  . Smoking status: Former Games developer  . Smokeless tobacco: Never Used  Substance and  Sexual Activity  . Alcohol use: No    Frequency: Never  . Drug use: No  . Sexual activity: Not on file  Other Topics Concern  . Not on file  Social History Narrative  . Not on file     Review of Systems    See HPI  All other systems reviewed and are otherwise negative except as noted above.  Physical Exam    Blood pressure 129/66, pulse 60, temperature 97.9 F (36.6 C), temperature source Oral, resp. rate 20, height 5\' 9"  (1.753 m), weight 153 lb 3.2 oz (69.5 kg), SpO2 97 %.  General: Pleasant, older AAM, NAD Psych: Normal affect. Neuro: Alert and oriented X 3. Moves all extremities spontaneously. HEENT: Normal  Neck: Supple without bruits or JVD. Lungs:  Resp regular and unlabored, CTA. Heart: RRR no s3, s4, or murmurs. Abdomen:  Soft, non-tender, non-distended, BS + x 4.  Extremities: No clubbing, cyanosis or edema. DP/PT/Radials 2+ and equal bilaterally.  Labs    Troponin (Point of Care Test) No results for input(s): TROPIPOC in the last 72 hours. Recent Labs    11/07/17 1527 11/07/17 2308 11/08/17 0109 11/08/17 0348  TROPONINI 0.06* 0.04* 0.04* 0.04*   Lab Results  Component Value Date   WBC 7.6 11/07/2017   HGB 11.8 (L) 11/07/2017   HCT 35.3 (L) 11/07/2017   MCV 84.9 11/07/2017   PLT 189 11/07/2017    Recent Labs  Lab 11/07/17 1527  NA 139  K 4.4  CL 108  CO2 22  BUN 26*  CREATININE 2.79*  CALCIUM 9.2  GLUCOSE 95   No results found for: CHOL, HDL, LDLCALC, TRIG No results found for: [facility]   Radiology Studies    Dg Chest 2 View  Result Date: 11/07/2017 CLINICAL DATA:  Chest pain today, dyspnea, former smoker. EXAM: CHEST - 2 VIEW COMPARISON:  None. FINDINGS: Heart size is normal. The patient is status post median sternotomy and aortic stent grafting from the aortic arch to proximal descending thoracic aorta. Lungs are hyperinflated but clear. Apical pleuroparenchymal thickening is noted bilaterally. No acute nor suspicious osseous lesions. Calcified left hilar lymph node is noted. IMPRESSION: Mild pulmonary hyperinflation without active pulmonary disease. Electronically Signed   By: Tollie Eth M.D.   On: 11/07/2017 15:43    ECG & Cardiac Imaging    EKG:  The EKG was personally reviewed and demonstrates SB with 1st degree AVB  Assessment & Plan    84 yo male with PMH of HTN, Thoracic aortic aneurysm s/p repair, and CKD stage IV who presented with hypertension and chest pain with positive troponin.   1. Chest pain/ Elevated Troponin: Reports episode of chest pain that was stabbing in nature yesterday around 11am that lasted for several hours. Trop with mild elevation 0.06>>0.05>>0.04 in the setting of hypertension and CKD but does have RFs. EKG non ischemic. Will plan for lexiscan  myoview today to rule out ischemia. Asked that patient hold and not eat his breakfast until after testing.   2. HTN: Improved after blood pressure medications.  3. CKD stage IV: Cr 2.7, patient reports he was not aware of kidney disease but wife reports otherwise. Suggest outpatient follow up with nephrology   Signed, Laverda Page, NP-C Pager [phone] 11/08/2017, 8:11 AM    Attending Note:   The patient was seen and examined.  Agree with assessment and plan as noted above.  Changes made to the above note as needed.  Patient seen and independently examined with  Laverda PageLindsay Roberts, NP .   We discussed all aspects of the encounter. I agree with the assessment and plan as stated above.  1.  Chest discomfort: Mr. Idell Picklessler presents with several hours of chest discomfort.  The pain was midsternal.  It started when he was in the kitchen cooking.  Was associated with some shortness of breath.  There was no pleuritic component. No nausea or vomiting.  Troponin levels are  minimally elevated with a relatively flat trend. We do not have any records but he probably had a heart catheterization prior to his thoracic aortic aneurysm repair in 2014.  I think it is reasonable to do a stress Myoview study for further evaluation.  2.  Chronic kidney disease. Stable.    I have spent a total of 40 minutes with patient reviewing hospital  notes , telemetry, EKGs, labs and examining patient as well as establishing an assessment and plan that was discussed with the patient. > 50% of time was spent in direct patient care.   Vesta MixerPhilip J. Caral Whan, Montez HagemanJr., MD, [facility] 11/08/2017, 8:29 AM [address],  [address] Office [phone] Pager [phone]

## 2017-11-08 NOTE — Progress Notes (Signed)
Discharge order obtained.  IV removed intact, telemetry monitor removed.  Reviewed AVS with patient/family, including medications and follow-up appointments.  Patient and family verbalized understanding.  Questions asked and answered.  Belongings given to family/patient.  Copy of AVS signature form placed in chart.

## 2017-11-08 NOTE — Progress Notes (Signed)
   Nicholas Lyons presented for a Lexsican cardiolite today.  No immediate complications.  Stress imaging is pending at this time.  Nicholas PageLindsay Carrieann Spielberg, NP 11/08/2017, 10:03 AM

## 2018-01-01 ENCOUNTER — Ambulatory Visit: Payer: Self-pay | Admitting: Surgery

## 2018-01-10 ENCOUNTER — Encounter (HOSPITAL_COMMUNITY): Payer: Self-pay | Admitting: Surgery

## 2018-01-10 DIAGNOSIS — K4090 Unilateral inguinal hernia, without obstruction or gangrene, not specified as recurrent: Secondary | ICD-10-CM

## 2018-01-10 NOTE — H&P (Signed)
General Surgery [facility]  Ronney Lion DOB: [birth year] Married / Language: Undefined / Race: Refused to Report/Unreported Male   History of Present Illness   The patient is a 84 year old male who presents with an inguinal hernia.  CC: right inguinal hernia  Patient is referred by Dr. Lynett Fish at [facility] for evaluation of right inguinal hernia. Patient first noted symptoms approximately 6 months ago. At that time they had been moving from one location to another and he had been doing some heavy lifting. Patient has noted the bulge coming slightly larger. He has intermittent episodes of discomfort. Hernia has always been reducible. He has never had any prior hernia repairs. He denies any signs or symptoms of intestinal obstruction. Patient presents today to discuss repair of right inguinal hernia with mesh.   Past Surgical History Aneurysm Repair   Diagnostic Studies History Colonoscopy  1-5 years ago  Allergies No Known Drug Allergies 07/07/2017 Allergies Reconciled   Medication History AmLODIPine Besylate (  Tablet, Oral) Active. Aspirin (  Tablet Chewable, Oral) Active. Medications Reconciled  Social History Alcohol use  Remotely quit alcohol use. Caffeine use  Carbonated beverages, Coffee. No drug use  Tobacco use  Former smoker.  Family History Family history unknown  First Degree Relatives   Other Problems Arthritis  Chest pain  High blood pressure   Review of Systems General Not Present- Appetite Loss, Chills, Fatigue, Fever, Night Sweats, Weight Gain and Weight Loss. Skin Not Present- Change in Wart/Mole, Dryness, Hives, Jaundice, New Lesions, Non-Healing Wounds, Rash and Ulcer. HEENT Present- Earache, Hearing Loss, Ringing in the Ears and Seasonal Allergies. Not Present- Hoarseness, Nose Bleed, Oral Ulcers, Sinus Pain, Sore Throat, Visual Disturbances, Wears glasses/contact lenses and Yellow  Eyes. Respiratory Present- Difficulty Breathing, Snoring and Wheezing. Not Present- Bloody sputum and Chronic Cough. Breast Not Present- Breast Mass, Breast Pain, Nipple Discharge and Skin Changes. Cardiovascular Present- Chest Pain, Leg Cramps and Shortness of Breath. Not Present- Difficulty Breathing Lying Down, Palpitations, Rapid Heart Rate and Swelling of Extremities. Gastrointestinal Not Present- Abdominal Pain, Bloating, Bloody Stool, Change in Bowel Habits, Chronic diarrhea, Constipation, Difficulty Swallowing, Excessive gas, Gets full quickly at meals, Hemorrhoids, Indigestion, Nausea, Rectal Pain and Vomiting. Male Genitourinary Not Present- Blood in Urine, Change in Urinary Stream, Frequency, Impotence, Nocturia, Painful Urination, Urgency and Urine Leakage. Musculoskeletal Not Present- Back Pain, Joint Pain, Joint Stiffness, Muscle Pain, Muscle Weakness and Swelling of Extremities. Neurological Not Present- Decreased Memory, Fainting, Headaches, Numbness, Seizures, Tingling, Tremor, Trouble walking and Weakness. Psychiatric Not Present- Anxiety, Bipolar, Change in Sleep Pattern, Depression, Fearful and Frequent crying. Endocrine Not Present- Cold Intolerance, Excessive Hunger, Hair Changes, Heat Intolerance, Hot flashes and New Diabetes. Hematology Not Present- Blood Thinners, Easy Bruising, Excessive bleeding, Gland problems, HIV and Persistent Infections.  Vitals Weight: 157.4 lb Height: 69in Body Surface Area: 1.87 m Body Mass Index: 23.24 kg/m  Temp.: 98.75F  Pulse: 81 (Regular)  BP: 142/84 (Sitting, Left Arm, Standard)  Physical Exam  CONSTITUTIONAL See vital signs recorded above  GENERAL APPEARANCE Development: normal Nutritional status: normal Gross deformities: none  SKIN Rash, lesions, ulcers: none Induration, erythema: none Nodules: none palpable  EYES Conjunctiva and lids: normal Pupils: equal and reactive Iris: normal bilaterally  EARS,  NOSE, MOUTH, THROAT External ears: no lesion or deformity External nose: no lesion or deformity Hearing: grossly normal Lips: no lesion or deformity Dentition: normal for age Oral mucosa: moist  NECK Symmetric: yes Trachea: midline Thyroid: no palpable nodules in  the thyroid bed  CHEST Respiratory effort: normal Retraction or accessory muscle use: no Breath sounds: normal bilaterally Rales, rhonchi, wheeze: none Well-healed transverse scar anterior upper chest wall and epigastrium  CARDIOVASCULAR Auscultation: regular rhythm, normal rate Murmurs: none Pulses: carotid and radial pulse 2+ palpable Lower extremity edema: none Lower extremity varicosities: none  ABDOMEN Distension: none Masses: none palpable Tenderness: none Hepatosplenomegaly: not present Hernia: not present  GENITOURINARY Penis: no lesions Scrotum: no masses Obvious bulge right groin. Palpation right inguinal canal with cough and Valsalva shows a small to moderate bulge which augments but is reducible with gentle manipulation. Hernia prolapses with slight Valsalva. Palpation in the left inguinal canal with cough and Valsalva shows no sign of hernia.  MUSCULOSKELETAL Station and gait: normal Digits and nails: no clubbing or cyanosis Muscle strength: grossly normal all extremities Range of motion: grossly normal all extremities Deformity: none  LYMPHATIC Cervical: none palpable Supraclavicular: none palpable  PSYCHIATRIC Oriented to person, place, and time: yes Mood and affect: normal for situation Judgment and insight: appropriate for situation    Assessment & Plan  INGUINAL HERNIA OF RIGHT SIDE WITHOUT OBSTRUCTION OR GANGRENE (K40.90)  Pt Education - Pamphlet Given - Hernia Surgery: discussed with patient and provided information. Patient is referred by his urologist for evaluation of right inguinal hernia. He is accompanied by a family member. They are provided with written literature on  hernia repair to review at home.  I have recommended repair of right inguinal hernia by open technique using prosthetic mesh. We have discussed the risk and benefits of the procedure. We discussed the use of mesh. We discussed restrictions on his activities following the surgery. This would be an outpatient surgical procedure. Patient understands and wishes to proceed with surgery after he returns from an upcoming trip to [place].  The risks and benefits of the procedure have been discussed at length with the patient. The patient understands the proposed procedure, potential alternative treatments, and the course of recovery to be expected. All of the patient's questions have been answered at this time. The patient wishes to proceed with surgery.  Darnell Level, MD [facility] Office: [phone]

## 2018-01-12 NOTE — Patient Instructions (Signed)
Nicholas Lyons  01/12/2018   Your procedure is scheduled on: 01-15-18   Report to [facility] Main  Entrance    Report to admitting at 11:45 AM    Call this number if you have problems the morning of surgery [phone]   Remember: Do not eat food or drink liquids :After Midnight.You may have a Clear Liquid Diet from Midnight until 7:45 AM. After 7:45AM, nothing until after surgery.     CLEAR LIQUID DIET   Foods Allowed                                                                     Foods Excluded  Coffee and tea, regular and decaf                             liquids that you cannot  Plain Jell-O in any flavor                                             see through such as: Fruit ices (not with fruit pulp)                                     milk, soups, orange juice  Iced Popsicles                                    All solid food Carbonated beverages, regular and diet                                    Cranberry, grape and apple juices Sports drinks like Gatorade Lightly seasoned clear broth or consume(fat free) Sugar, honey syrup  Sample Menu Breakfast                                Lunch                                     Supper Cranberry juice                    Beef broth                            Chicken broth Jell-O                                     Grape juice                           Apple  juice Coffee or tea                        Jell-O                                      Popsicle                                                Coffee or tea                        Coffee or tea  _____________________________________________________________________     Take these medicines the morning of surgery with A SIP OF WATER: Amlodipine (Norvasc)              You may not have any metal on your body including hair pins and              piercings  Do not wear jewelry, lotions, powders or deodorant             Men may shave face and  neck.   Do not bring valuables to the hospital. [place] IS NOT             RESPONSIBLE   FOR VALUABLES.  Contacts, dentures or bridgework may not be worn into surgery.      Patients discharged the day of surgery will not be allowed to drive home.  Name and phone number of your driver:  Special Instructions: N/A              Please read over the following fact sheets you were given: _____________________________________________________________________             [facility] - Preparing for Surgery Before surgery, you can play an important role.  Because skin is not sterile, your skin needs to be as free of germs as possible.  You can reduce the number of germs on your skin by washing with CHG (chlorahexidine gluconate) soap before surgery.  CHG is an antiseptic cleaner which kills germs and bonds with the skin to continue killing germs even after washing. Please DO NOT use if you have an allergy to CHG or antibacterial soaps.  If your skin becomes reddened/irritated stop using the CHG and inform your nurse when you arrive at Short Stay. Do not shave (including legs and underarms) for at least 48 hours prior to the first CHG shower.  You may shave your face/neck. Please follow these instructions carefully:  1.  Shower with CHG Soap the night before surgery and the  morning of Surgery.  2.  If you choose to wash your hair, wash your hair first as usual with your  normal  shampoo.  3.  After you shampoo, rinse your hair and body thoroughly to remove the  shampoo.                           4.  Use CHG as you would any other liquid soap.  You can apply chg directly  to the skin and wash                       Gently  with a scrungie or clean washcloth.  5.  Apply the CHG Soap to your body ONLY FROM THE NECK DOWN.   Do not use on face/ open                           Wound or open sores. Avoid contact with eyes, ears mouth and genitals (private parts).                       Wash face,   Genitals (private parts) with your normal soap.             6.  Wash thoroughly, paying special attention to the area where your surgery  will be performed.  7.  Thoroughly rinse your body with warm water from the neck down.  8.  DO NOT shower/wash with your normal soap after using and rinsing off  the CHG Soap.                9.  Pat yourself dry with a clean towel.            10.  Wear clean pajamas.            11.  Place clean sheets on your bed the night of your first shower and do not  sleep with pets. Day of Surgery : Do not apply any lotions/deodorants the morning of surgery.  Please wear clean clothes to the hospital/surgery center.  FAILURE TO FOLLOW THESE INSTRUCTIONS MAY RESULT IN THE CANCELLATION OF YOUR SURGERY PATIENT SIGNATURE_________________________________  NURSE SIGNATURE__________________________________  ________________________________________________________________________

## 2018-01-12 NOTE — Progress Notes (Signed)
11-11-17 (Epic) EKG, CXR

## 2018-01-13 ENCOUNTER — Other Ambulatory Visit: Payer: Self-pay

## 2018-01-13 ENCOUNTER — Encounter (HOSPITAL_COMMUNITY)
Admission: RE | Admit: 2018-01-13 | Discharge: 2018-01-13 | Disposition: A | Discharge status: Home / Self Care | Payer: Medicare Other | Source: Ambulatory Visit | Attending: Surgery | Admitting: Surgery

## 2018-01-13 ENCOUNTER — Encounter (HOSPITAL_COMMUNITY): Payer: Self-pay

## 2018-01-13 DIAGNOSIS — Z79899 Other long term (current) drug therapy: Secondary | ICD-10-CM | POA: Diagnosis not present

## 2018-01-13 DIAGNOSIS — I10 Essential (primary) hypertension: Secondary | ICD-10-CM | POA: Diagnosis not present

## 2018-01-13 DIAGNOSIS — K4090 Unilateral inguinal hernia, without obstruction or gangrene, not specified as recurrent: Secondary | ICD-10-CM | POA: Diagnosis present

## 2018-01-13 DIAGNOSIS — D649 Anemia, unspecified: Secondary | ICD-10-CM | POA: Diagnosis not present

## 2018-01-13 DIAGNOSIS — Z87891 Personal history of nicotine dependence: Secondary | ICD-10-CM | POA: Diagnosis not present

## 2018-01-13 DIAGNOSIS — Z7982 Long term (current) use of aspirin: Secondary | ICD-10-CM | POA: Diagnosis not present

## 2018-01-13 LAB — BASIC METABOLIC PANEL
Anion gap: 8 (ref 5–15)
BUN: 29 mg/dL — ABNORMAL HIGH (ref 6–20)
CHLORIDE: 111 mmol/L (ref 101–111)
CO2: 23 mmol/L (ref 22–32)
CREATININE: 2.62 mg/dL — AB (ref 0.61–1.24)
Calcium: 9.5 mg/dL (ref 8.9–10.3)
GFR calc non Af Amer: 21 mL/min — ABNORMAL LOW (ref >60)
GFR, EST AFRICAN AMERICAN: 24 mL/min — AB (ref >60)
Glucose, Bld: 91 mg/dL (ref 65–99)
Potassium: 4.9 mmol/L (ref 3.5–5.1)
Sodium: 142 mmol/L (ref 135–145)

## 2018-01-13 LAB — CBC
HCT: 37.4 % — ABNORMAL LOW (ref 39.0–52.0)
HEMOGLOBIN: 12.6 g/dL — AB (ref 13.0–17.0)
MCH: 28.8 pg (ref 26.0–34.0)
MCHC: 33.7 g/dL (ref 30.0–36.0)
MCV: 85.4 fL (ref 78.0–100.0)
PLATELETS: 219 10*3/uL (ref 150–400)
RBC: 4.38 MIL/uL (ref 4.22–5.81)
RDW: 13.7 % (ref 11.5–15.5)
WBC: 7.5 10*3/uL (ref 4.0–10.5)

## 2018-01-13 NOTE — Progress Notes (Signed)
01-13-18 Discussed with Dr. Krista Blue regarding Thoracic Aortic Aneurysm which measured 4.2cm on  10-29-16 office visit. No current information available, and pt scheduled to have Inguinal Hernia repair on 01-15-18. Per Dr. Krista Blue, pt is okay to proceed with surgery.

## 2018-01-15 ENCOUNTER — Ambulatory Visit (HOSPITAL_COMMUNITY): Payer: Medicare Other | Admitting: Anesthesiology

## 2018-01-15 ENCOUNTER — Encounter (HOSPITAL_COMMUNITY): Payer: Self-pay | Admitting: Surgery

## 2018-01-15 ENCOUNTER — Ambulatory Visit (HOSPITAL_COMMUNITY)
Admission: RE | Admit: 2018-01-15 | Discharge: 2018-01-15 | Disposition: A | Discharge status: Home / Self Care | Payer: Medicare Other | Source: Ambulatory Visit | Attending: Surgery | Admitting: Surgery

## 2018-01-15 ENCOUNTER — Encounter (HOSPITAL_COMMUNITY)
Admission: RE | Disposition: A | Discharge status: Home / Self Care | Payer: Self-pay | Source: Ambulatory Visit | Attending: Surgery

## 2018-01-15 ENCOUNTER — Other Ambulatory Visit: Payer: Self-pay

## 2018-01-15 DIAGNOSIS — Z87891 Personal history of nicotine dependence: Secondary | ICD-10-CM | POA: Insufficient documentation

## 2018-01-15 DIAGNOSIS — K4090 Unilateral inguinal hernia, without obstruction or gangrene, not specified as recurrent: Secondary | ICD-10-CM | POA: Diagnosis not present

## 2018-01-15 DIAGNOSIS — Z79899 Other long term (current) drug therapy: Secondary | ICD-10-CM | POA: Diagnosis not present

## 2018-01-15 DIAGNOSIS — I10 Essential (primary) hypertension: Secondary | ICD-10-CM | POA: Insufficient documentation

## 2018-01-15 DIAGNOSIS — D649 Anemia, unspecified: Secondary | ICD-10-CM | POA: Insufficient documentation

## 2018-01-15 DIAGNOSIS — Z7982 Long term (current) use of aspirin: Secondary | ICD-10-CM | POA: Insufficient documentation

## 2018-01-15 HISTORY — PX: INSERTION OF MESH: SHX5868

## 2018-01-15 HISTORY — PX: INGUINAL HERNIA REPAIR: SHX194

## 2018-01-15 SURGERY — REPAIR, HERNIA, INGUINAL, ADULT
Anesthesia: Regional | Site: Inguinal | Laterality: Right

## 2018-01-15 MED ORDER — BUPIVACAINE HCL (PF) 0.5 % IJ SOLN
INTRAMUSCULAR | Status: AC
  Filled 2018-01-15: qty 30

## 2018-01-15 MED ORDER — GLYCOPYRROLATE 0.2 MG/ML IV SOSY
PREFILLED_SYRINGE | INTRAVENOUS | Status: AC
  Filled 2018-01-15: qty 5

## 2018-01-15 MED ORDER — ONDANSETRON HCL 4 MG/2ML IJ SOLN
INTRAMUSCULAR | Status: DC | PRN
  Administered 2018-01-15: 4 mg via INTRAVENOUS

## 2018-01-15 MED ORDER — FENTANYL CITRATE (PF) 100 MCG/2ML IJ SOLN: 25.0000 ug | INTRAMUSCULAR | Status: DC | PRN

## 2018-01-15 MED ORDER — ONDANSETRON HCL 4 MG/2ML IJ SOLN
INTRAMUSCULAR | Status: AC
  Filled 2018-01-15: qty 2

## 2018-01-15 MED ORDER — SODIUM CHLORIDE 0.9 % IV SOLN
INTRAVENOUS | Status: DC
  Administered 2018-01-15: 12:00:00 via INTRAVENOUS

## 2018-01-15 MED ORDER — PROPOFOL 10 MG/ML IV BOLUS
INTRAVENOUS | Status: DC | PRN
  Administered 2018-01-15: 150 mg via INTRAVENOUS

## 2018-01-15 MED ORDER — LIDOCAINE 2% (20 MG/ML) 5 ML SYRINGE
INTRAMUSCULAR | Status: AC
  Filled 2018-01-15: qty 5

## 2018-01-15 MED ORDER — LIDOCAINE 2% (20 MG/ML) 5 ML SYRINGE
INTRAMUSCULAR | Status: DC | PRN
  Administered 2018-01-15: 100 mg via INTRAVENOUS

## 2018-01-15 MED ORDER — EPHEDRINE 5 MG/ML INJ
INTRAVENOUS | Status: AC
  Filled 2018-01-15: qty 10

## 2018-01-15 MED ORDER — CHLORHEXIDINE GLUCONATE CLOTH 2 % EX PADS: 6.0000 | MEDICATED_PAD | Freq: Once | CUTANEOUS | Status: DC

## 2018-01-15 MED ORDER — PHENYLEPHRINE 40 MCG/ML (10ML) SYRINGE FOR IV PUSH (FOR BLOOD PRESSURE SUPPORT)
PREFILLED_SYRINGE | INTRAVENOUS | Status: AC
  Filled 2018-01-15: qty 10

## 2018-01-15 MED ORDER — 0.9 % SODIUM CHLORIDE (POUR BTL) OPTIME
TOPICAL | Status: DC | PRN
  Administered 2018-01-15: 1000 mL

## 2018-01-15 MED ORDER — HYDROCODONE-ACETAMINOPHEN 5-325 MG PO TABS: 1.0000 | ORAL_TABLET | Freq: Four times a day (QID) | ORAL | 0 refills | Status: AC | PRN

## 2018-01-15 MED ORDER — ONDANSETRON HCL 4 MG/2ML IJ SOLN: 4.0000 mg | Freq: Once | INTRAMUSCULAR | Status: DC | PRN

## 2018-01-15 MED ORDER — EPHEDRINE SULFATE-NACL 50-0.9 MG/10ML-% IV SOSY
PREFILLED_SYRINGE | INTRAVENOUS | Status: DC | PRN
  Administered 2018-01-15: 5 mg via INTRAVENOUS

## 2018-01-15 MED ORDER — PROPOFOL 10 MG/ML IV BOLUS
INTRAVENOUS | Status: AC
  Filled 2018-01-15: qty 20

## 2018-01-15 MED ORDER — DEXAMETHASONE SODIUM PHOSPHATE 10 MG/ML IJ SOLN
INTRAMUSCULAR | Status: DC | PRN
  Administered 2018-01-15: 10 mg via INTRAVENOUS

## 2018-01-15 MED ORDER — BUPIVACAINE HCL (PF) 0.5 % IJ SOLN
INTRAMUSCULAR | Status: DC | PRN
  Administered 2018-01-15: 20 mL

## 2018-01-15 MED ORDER — FENTANYL CITRATE (PF) 100 MCG/2ML IJ SOLN
50.0000 ug | INTRAMUSCULAR | Status: DC
  Administered 2018-01-15: 100 ug via INTRAVENOUS
  Filled 2018-01-15: qty 2

## 2018-01-15 MED ORDER — FENTANYL CITRATE (PF) 100 MCG/2ML IJ SOLN
INTRAMUSCULAR | Status: DC | PRN
  Administered 2018-01-15 (×4): 25 ug via INTRAVENOUS

## 2018-01-15 MED ORDER — CEFAZOLIN SODIUM-DEXTROSE 2-4 GM/100ML-% IV SOLN
2.0000 g | INTRAVENOUS | Status: AC
  Administered 2018-01-15: 2 g via INTRAVENOUS
  Filled 2018-01-15: qty 100

## 2018-01-15 MED ORDER — PHENYLEPHRINE 40 MCG/ML (10ML) SYRINGE FOR IV PUSH (FOR BLOOD PRESSURE SUPPORT)
PREFILLED_SYRINGE | INTRAVENOUS | Status: DC | PRN
  Administered 2018-01-15 (×2): 80 ug via INTRAVENOUS

## 2018-01-15 MED ORDER — FENTANYL CITRATE (PF) 100 MCG/2ML IJ SOLN
INTRAMUSCULAR | Status: AC
  Filled 2018-01-15: qty 2

## 2018-01-15 MED ORDER — GLYCOPYRROLATE 0.2 MG/ML IV SOSY
PREFILLED_SYRINGE | INTRAVENOUS | Status: DC | PRN
  Administered 2018-01-15: 0.4 mg via INTRAVENOUS

## 2018-01-15 MED ORDER — DEXAMETHASONE SODIUM PHOSPHATE 10 MG/ML IJ SOLN
INTRAMUSCULAR | Status: AC
  Filled 2018-01-15: qty 1

## 2018-01-15 MED ORDER — ROPIVACAINE HCL 5 MG/ML IJ SOLN
INTRAMUSCULAR | Status: DC | PRN
  Administered 2018-01-15: 30 mL via PERINEURAL

## 2018-01-15 SURGICAL SUPPLY — 34 items
BLADE SURG 15 STRL LF DISP TIS (BLADE) ×1 IMPLANT
BLADE SURG 15 STRL SS (BLADE) ×1
BLADE SURG SZ10 CARB STEEL (BLADE) ×2 IMPLANT
CHLORAPREP W/TINT 26ML (MISCELLANEOUS) ×4 IMPLANT
COVER SURGICAL LIGHT HANDLE (MISCELLANEOUS) ×2 IMPLANT
DECANTER SPIKE VIAL GLASS SM (MISCELLANEOUS) ×2 IMPLANT
DRAIN PENROSE 18X1/2 LTX STRL (DRAIN) ×2 IMPLANT
DRAPE LAPAROTOMY TRNSV 102X78 (DRAPE) ×2 IMPLANT
ELECT PENCIL ROCKER SW 15FT (MISCELLANEOUS) ×2 IMPLANT
ELECT REM PT RETURN 15FT ADLT (MISCELLANEOUS) ×2 IMPLANT
GAUZE SPONGE 4X4 12PLY STRL (GAUZE/BANDAGES/DRESSINGS) ×2 IMPLANT
GLOVE BIOGEL PI IND STRL 6.5 (GLOVE) ×2 IMPLANT
GLOVE BIOGEL PI IND STRL 7.0 (GLOVE) ×3 IMPLANT
GLOVE BIOGEL PI INDICATOR 6.5 (GLOVE) ×2
GLOVE BIOGEL PI INDICATOR 7.0 (GLOVE) ×3
GLOVE SURG ORTHO 8.0 STRL STRW (GLOVE) ×2 IMPLANT
GOWN STRL REUS W/TWL LRG LVL3 (GOWN DISPOSABLE) ×2 IMPLANT
GOWN STRL REUS W/TWL XL LVL3 (GOWN DISPOSABLE) ×4 IMPLANT
KIT BASIN OR (CUSTOM PROCEDURE TRAY) ×2 IMPLANT
MESH ULTRAPRO 3X6 7.6X15CM (Mesh General) ×2 IMPLANT
NEEDLE HYPO 25X1 1.5 SAFETY (NEEDLE) ×2 IMPLANT
PACK BASIC VI WITH GOWN DISP (CUSTOM PROCEDURE TRAY) ×2 IMPLANT
SPONGE LAP 4X18 RFD (DISPOSABLE) ×2 IMPLANT
STRIP CLOSURE SKIN 1/2X4 (GAUZE/BANDAGES/DRESSINGS) ×2 IMPLANT
SUT MNCRL AB 4-0 PS2 18 (SUTURE) ×2 IMPLANT
SUT NOVA NAB GS-21 0 18 T12 DT (SUTURE) ×2 IMPLANT
SUT NOVA NAB GS-22 2 0 T19 (SUTURE) ×4 IMPLANT
SUT SILK 2 0 SH (SUTURE) ×2 IMPLANT
SUT VIC AB 3-0 SH 18 (SUTURE) ×2 IMPLANT
SYR BULB IRRIGATION 50ML (SYRINGE) ×2 IMPLANT
SYR CONTROL 10ML LL (SYRINGE) ×2 IMPLANT
TAPE CLOTH SURG 4X10 WHT LF (GAUZE/BANDAGES/DRESSINGS) ×2 IMPLANT
TOWEL OR 17X26 10 PK STRL BLUE (TOWEL DISPOSABLE) ×2 IMPLANT
YANKAUER SUCT BULB TIP 10FT TU (MISCELLANEOUS) ×2 IMPLANT

## 2018-01-15 NOTE — Op Note (Signed)
Inguinal Hernia, Open, Procedure Note  Pre-operative Diagnosis:  Right inguinal hernia, reducible  Post-operative Diagnosis: same  Surgeon:  Velora Heckler, MD, FACS  Anesthesia:  General  Preparation:  Chlora-prep  Estimated Blood Loss: minimal  Complications:  none  Indications: The patient presented with a right, reducible hernia.    Procedure Details  The patient was evaluated in the holding area. All of the patient's questions were answered and the proposed procedure was confirmed. The site of the procedure was properly marked. The patient was taken to the Operating Room, identified by name, and the procedure verified as inguinal hernia repair.  The patient was placed in the supine position and underwent induction of anesthesia. A "Time Out" was performed per routine. The lower abdomen and groin were prepped and draped in the usual aseptic fashion.  After ascertaining that an adequate level of anesthesia had been obtained, an incision was made in the groin with a #10 blade.  Dissection was carried through the subcutaneous tissues and hemostasis obtained with the electrocautery.  A Gelpi retractor was placed for exposure.  The external oblique fascia was incised in line with it's fibers and extended through the external inguinal ring.  The cord structures were dissected out of the inguinal canal and encircled with a Penrose drain.  The floor of the inguinal canal was dissected out.  There was a direct inguinal hernia defect in the floor.  Sac was dissected out and reduced.  The defect was closed with interrupted 0-Novofil simple sutures.  The cord was explored and there was no indirect hernia sac found.  The floor of the inguinal canal was reconstructed with Ethicon Ultrapro mesh cut to the appropriate dimensions.  It was secured to the pubic tubercle with a 2-0 Novafil suture and along the inguinal ligament with a running 2-0 Novafil suture.  Mesh was split to accommodate the cord  structures.  The superior margin of the mesh was secured to the transversalis and internal oblique musculature with interrupted 2-0 Novafil sutures.  The tails of the mesh were overlapped lateral to the cord structures and secured to the inguinal ligament with interrupted 2-0 Novafil sutures to recreate the internal inguinal ring.  Cord structures were returned to the inguinal canal.  Local anesthetic was infiltrated throughout the field.  External oblique fascia was closed with interrupted 3-0 Vicryl sutures.  Subcutaneous tissues were closed with interrupted 3-0 Vicryl sutures.  Skin was anesthetized with local anesthetic, and the skin edges were re-approximated with a running 4-0 Monocryl suture.  Wound was washed and dried and steristrips were applied.  A dry gauze dressing was applied.  Instrument, sponge, and needle counts were correct prior to closure and at the conclusion of the case.  The patient tolerated the procedure well.  The patient was awakened from anesthesia and brought to the recovery room in stable condition.  Darnell Level, MD [facility] Office: [phone]

## 2018-01-15 NOTE — Anesthesia Procedure Notes (Signed)
Procedure Name: LMA Insertion Date/Time: 01/15/2018 4:24 PM Performed by: Florene Route, CRNA Patient Re-evaluated:Patient Re-evaluated prior to induction Oxygen Delivery Method: Circle system utilized Preoxygenation: Pre-oxygenation with 100% oxygen Induction Type: IV induction Ventilation: Mask ventilation without difficulty LMA: LMA inserted LMA Size: 4.0 Placement Confirmation: positive ETCO2 and breath sounds checked- equal and bilateral Tube secured with: Tape Dental Injury: Teeth and Oropharynx as per pre-operative assessment

## 2018-01-15 NOTE — Progress Notes (Signed)
Assisted Dr. Ellender with right, ultrasound guided, transabdominal plane block. Side rails up, monitors on throughout procedure. See vital signs in flow sheet. Tolerated Procedure well. ?

## 2018-01-15 NOTE — Interval H&P Note (Signed)
History and Physical Interval Note:  01/15/2018 3:54 PM  Nicholas Lyons  has presented today for surgery, with the diagnosis of right inguinal hernia, reducible  The various methods of treatment have been discussed with the patient and family. After consideration of risks, benefits and other options for treatment, the patient has consented to    Procedure(s): OPEN RIGHT INGUINAL HERNIA REPAIR (Right) INSERTION OF MESH (Right) as a surgical intervention .    The patient's history has been reviewed, patient examined, no change in status, stable for surgery.  I have reviewed the patient's chart and labs.  Questions were answered to the patient's satisfaction.    Darnell Level, MD [facility] Office: [phone]   Keisa Blow Judie Petit

## 2018-01-15 NOTE — Anesthesia Procedure Notes (Signed)
Anesthesia Regional Block: TAP block   Pre-Anesthetic Checklist: ,, timeout performed, Correct Patient, Correct Site, Correct Laterality, Correct Procedure,, site marked, risks and benefits discussed, Surgical consent,  Pre-op evaluation,  At surgeon's request and post-op pain management  Laterality: Right  Prep: chloraprep       Needles:  Injection technique: Single-shot  Needle Type: Echogenic Stimulator Needle     Needle Length: 10cm  Needle Gauge: 21     Additional Needles:   Procedures:,,,, ultrasound used (permanent image in chart),,,,  Narrative:  Start time: 01/15/2018 2:10 PM End time: 01/15/2018 2:20 PM Injection made incrementally with aspirations every 5 mL.  Performed by: Personally  Anesthesiologist: Leonides Grills, MD  Additional Notes: Functioning IV was confirmed and monitors were applied.  A 21ga Pajunk echogenic stimulator needle was used. Sterile prep, hand hygiene and sterile gloves were used.  Negative aspiration and negative test dose prior to incremental administration of local anesthetic. The patient tolerated the procedure well.

## 2018-01-15 NOTE — Anesthesia Postprocedure Evaluation (Signed)
Anesthesia Post Note  Patient: Nicholas Lyons  Procedure(s) Performed: OPEN RIGHT INGUINAL HERNIA REPAIR (Right Inguinal) INSERTION OF MESH (Right Inguinal)     Patient location during evaluation: PACU Anesthesia Type: Regional and General Level of consciousness: awake and alert Pain management: pain level controlled Vital Signs Assessment: post-procedure vital signs reviewed and stable Respiratory status: spontaneous breathing, nonlabored ventilation, respiratory function stable and patient connected to nasal cannula oxygen Cardiovascular status: blood pressure returned to baseline and stable Postop Assessment: no apparent nausea or vomiting Anesthetic complications: no    Last Vitals:  Vitals:   01/15/18 1800 01/15/18 1825  BP: 134/75 139/82  Pulse: 95 90  Resp: 18 16  Temp: (!) 36.4 C 36.6 C  SpO2: 98% 99%    Last Pain:  Vitals:   01/15/18 1810  TempSrc:   PainSc: 0-No pain                 Ryan P Ellender

## 2018-01-15 NOTE — Discharge Instructions (Signed)
[facility] ° °HERNIA REPAIR °POST OP INSTRUCTIONS ° °Always review your discharge instruction sheet given to you by the facility where your surgery was performed. ° °1. A  prescription for pain medication may be given to you upon discharge.  Take your pain medication as prescribed.  If narcotic pain medicine is not needed, then you may take acetaminophen (Tylenol) or ibuprofen (Advil) as needed. ° °2. Take your usually prescribed medications unless otherwise directed. ° °3. If you need a refill on your pain medication, please contact your pharmacy.  They will contact our office to request authorization. Prescriptions will not be filled after 5 pm daily or on weekends. ° °4. You should follow a light diet the first 24 hours after arrival home, such as soup and crackers or toast.  Be sure to include plenty of fluids daily.  Resume your normal diet the day after surgery. ° °5. Most patients will experience some swelling and bruising around the surgical site.  Ice packs and reclining will help.  Swelling and bruising can take several days to resolve.  ° °6. It is common to experience some constipation if taking pain medication after surgery.  Increasing fluid intake and taking a stool softener (such as Colace) will usually help or prevent this problem from occurring.  A mild laxative (Milk of Magnesia or Miralax) should be taken according to package directions if there are no bowel movements after 48 hours. ° °7. Unless discharge instructions indicate otherwise, you may remove your bandages 24-48 hours after surgery, and you may shower at that time.  You will have steri-strips (small skin tapes) in place directly over the incision.  These strips should be left on the skin for 5-7 days.  Any sutures or staples will be removed at the office during your follow-up visit. ° °8. ACTIVITIES:  You may resume regular (light) daily activities beginning the next day - such as daily self-care, walking, climbing  stairs - gradually increasing activities as tolerated.  You may have sexual intercourse when it is comfortable.  Refrain from any heavy lifting or straining until approved by your doctor.  You may drive when you are no longer taking prescription pain medication, you can comfortably wear a seatbelt, and you can safely maneuver your car and apply brakes. ° °9. You should see your doctor in the office for a follow-up appointment approximately 2-3 weeks after your surgery.  Make sure that you call for this appointment within a day or two after you arrive home to insure a convenient appointment time. °10.  ° °WHEN TO CALL YOUR DOCTOR: °1. Fever greater than 101.0 °2. Inability to urinate °3. Persistent nausea and/or vomiting °4. Extreme swelling or bruising °5. Continued bleeding from incision °6. Increased pain, redness, or drainage from the incision ° °The clinic staff is available to answer your questions during regular business hours.  Please don’t hesitate to call and ask to speak to one of the nurses for clinical concerns.  If you have a medical emergency, go to the nearest emergency room or call 911.  A surgeon from [facility] is always on call for the hospital. ° ° °[facility] °[address], [address], [place], [place]  [address] ° °[phone] ? [phone] ? FAX [phone] ° °[email or web address] ° °

## 2018-01-15 NOTE — Anesthesia Preprocedure Evaluation (Addendum)
Anesthesia Evaluation  Patient identified by MRN, date of birth, ID band Patient awake    Reviewed: Allergy & Precautions, NPO status , Patient's Chart, lab work & pertinent test results  Airway Mallampati: II  TM Distance: >3 FB Neck ROM: Full    Dental  (+) Missing   Pulmonary former smoker,    Pulmonary exam normal breath sounds clear to auscultation       Cardiovascular hypertension, Pt. on medications Normal cardiovascular exam Rhythm:Regular Rate:Normal  ECG: SB, 1st degree AV block, rate 52  There was no ST segment deviation noted during stress. No T wave inversion was noted during stress. Nuclear stress EF: 54%. The left ventricular ejection fraction is mildly decreased (45-54%). This is a low risk study. The study is normal.  Thoracic aortic aneurysm     Neuro/Psych negative neurological ROS  negative psych ROS   GI/Hepatic negative GI ROS, Neg liver ROS,   Endo/Other  negative endocrine ROS  Renal/GU Renal disease     Musculoskeletal negative musculoskeletal ROS (+)   Abdominal   Peds  Hematology  (+) anemia ,   Anesthesia Other Findings right inguinal hernia  Reproductive/Obstetrics                            Anesthesia Physical Anesthesia Plan  ASA: II  Anesthesia Plan: General and Regional   Post-op Pain Management: GA combined w/ Regional for post-op pain   Induction: Intravenous  PONV Risk Score and Plan: 2 and Dexamethasone, Ondansetron and Treatment may vary due to age or medical condition  Airway Management Planned: LMA  Additional Equipment:   Intra-op Plan:   Post-operative Plan: Extubation in OR  Informed Consent: I have reviewed the patients History and Physical, chart, labs and discussed the procedure including the risks, benefits and alternatives for the proposed anesthesia with the patient or authorized representative who has indicated his/her  understanding and acceptance.   Dental advisory given  Plan Discussed with: CRNA  Anesthesia Plan Comments:         Anesthesia Quick Evaluation

## 2018-01-15 NOTE — Transfer of Care (Signed)
Immediate Anesthesia Transfer of Care Note  Patient: Nicholas Lyons  Procedure(s) Performed: Procedure(s): OPEN RIGHT INGUINAL HERNIA REPAIR (Right) INSERTION OF MESH (Right)  Patient Location: PACU  Anesthesia Type:General  Level of Consciousness:  sedated, patient cooperative and responds to stimulation  Airway & Oxygen Therapy:Patient Spontanous Breathing and Patient connected to face mask oxgen  Post-op Assessment:  Report given to PACU RN and Post -op Vital signs reviewed and stable  Post vital signs:  Reviewed and stable  Last Vitals:  Vitals:   01/15/18 1500 01/15/18 1733  BP: 138/76 (P) 135/82  Pulse:    Resp: 16   Temp:  (!) (P) 36.4 C  SpO2: 100%     Complications: No apparent anesthesia complications

## 2018-01-16 ENCOUNTER — Encounter (HOSPITAL_COMMUNITY): Payer: Self-pay | Admitting: Surgery
